# Patient Record
Sex: Male | Born: 1975 | Race: White | Hispanic: No | Smoking: Current every day smoker
Health system: Southern US, Community
[De-identification: ages and names within clinical notes are randomized; demographics above are authoritative.]

## PROBLEM LIST (undated history)

## (undated) HISTORY — PX: BACK SURGERY: SHX140

---

## 2006-05-12 ENCOUNTER — Emergency Department (HOSPITAL_COMMUNITY): Admission: EM | Admit: 2006-05-12 | Discharge: 2006-05-12 | Payer: Self-pay | Admitting: Emergency Medicine

## 2008-01-25 IMAGING — CR DG LUMBAR SPINE COMPLETE 4+V
5 series · 5 of 5 positions shown · non-contrast
Comparison: none

HISTORY: Low back pain, injured after lifting something heavy one week ago

[t l-spine a.p.]
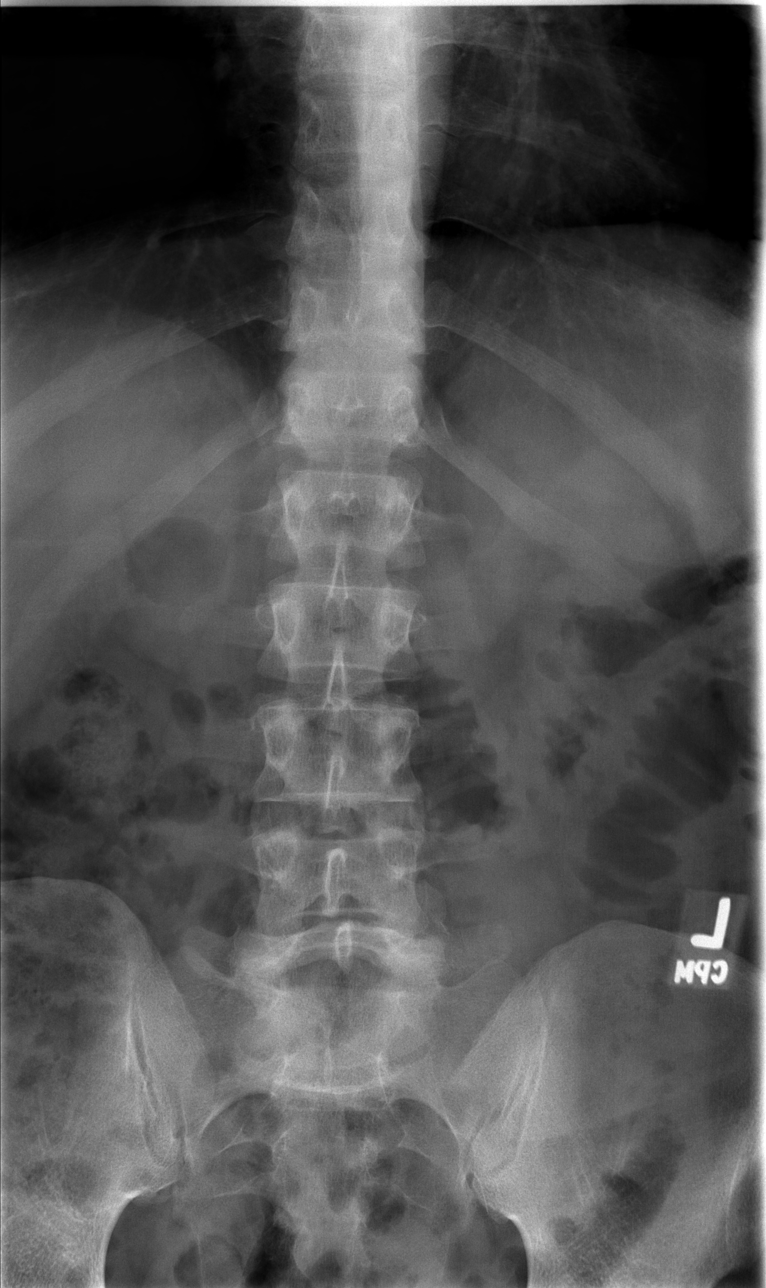

[t l-spine oblique exposure (1 of 2)]
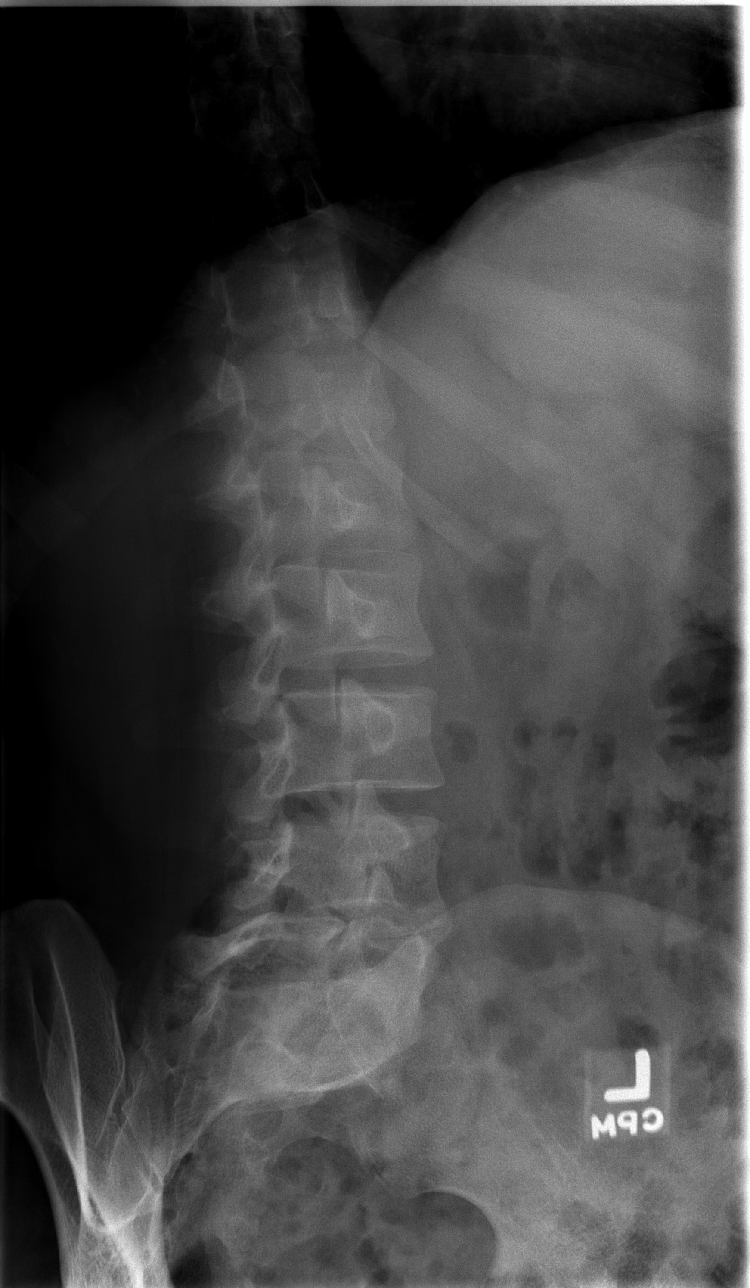

[t l-spine oblique exposure (2 of 2)]
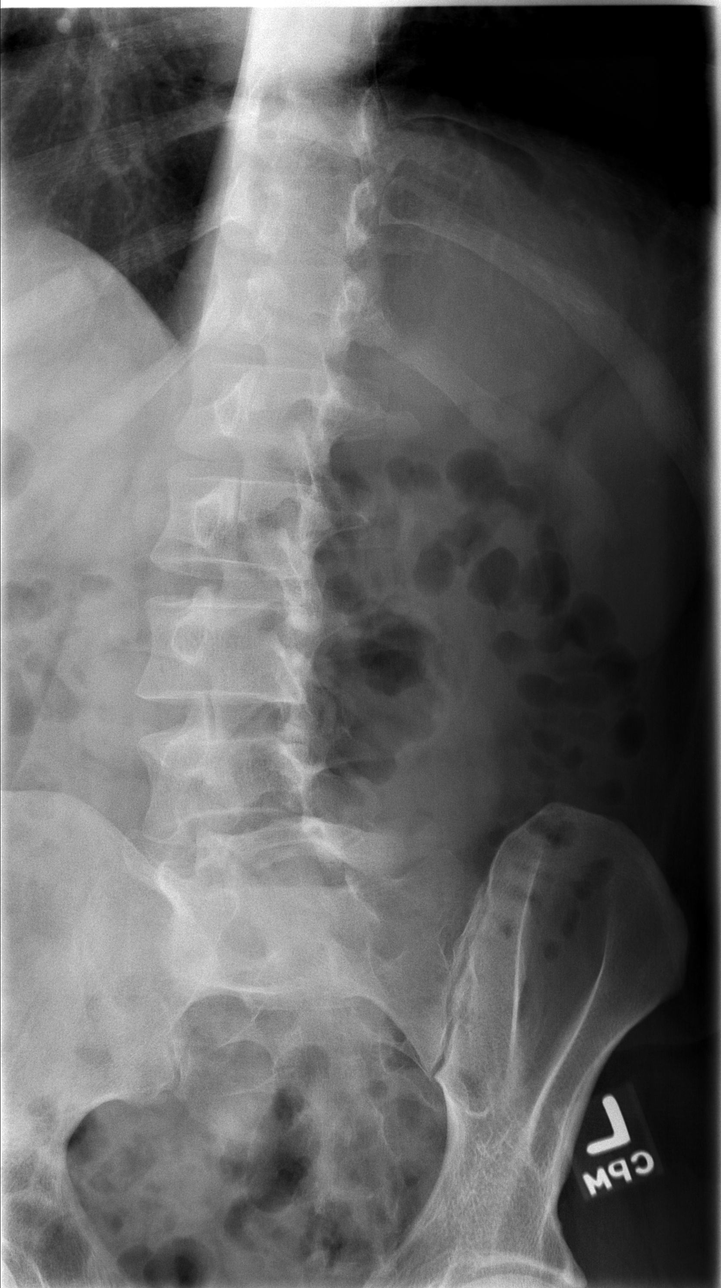

[t l-spine lat]
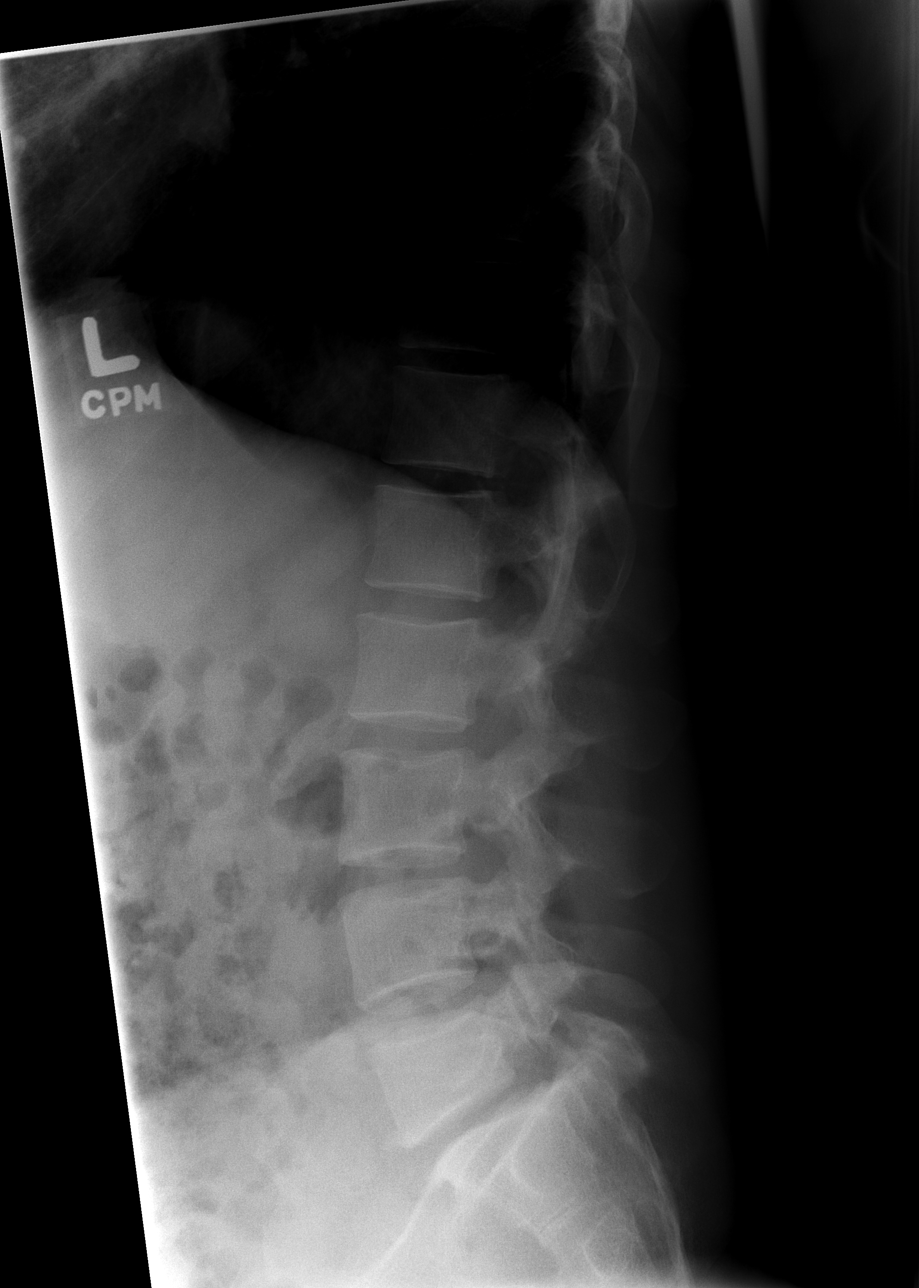

[t l-spine l5-s1 spot]
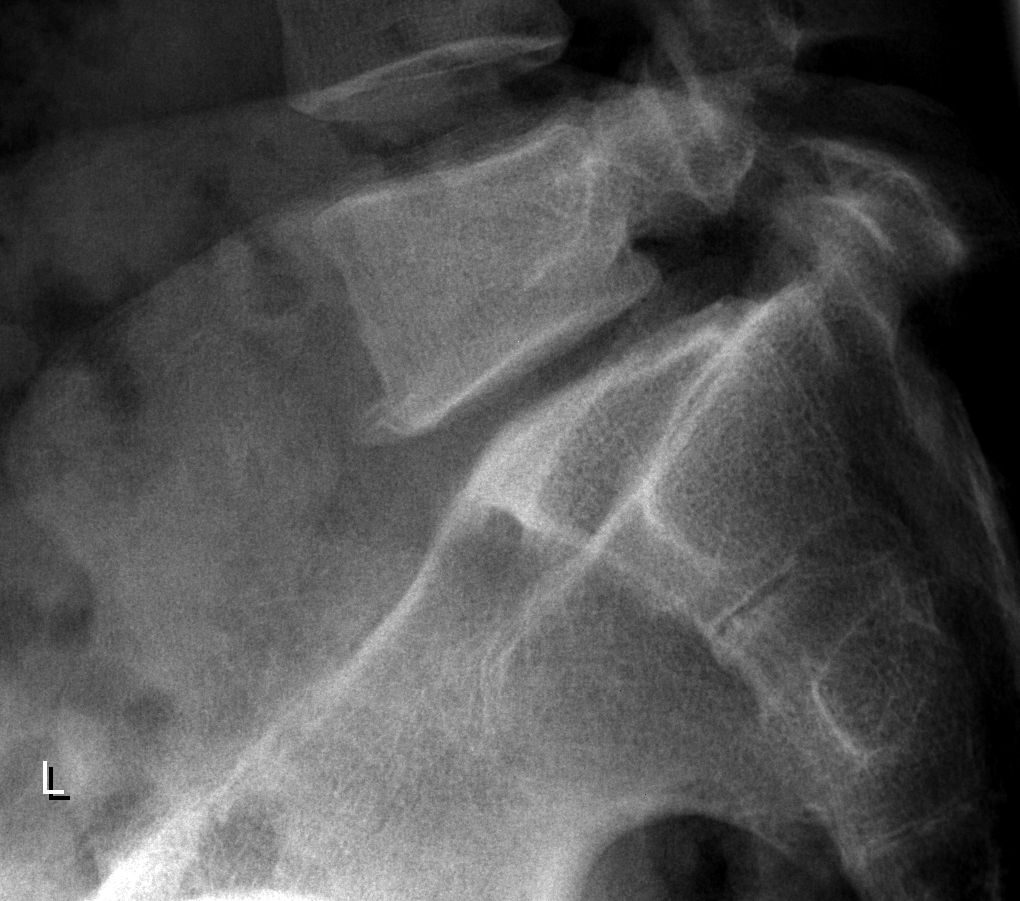

[5 of 5 positions shown; findings below may reference images not displayed]

LUMBAR SPINE 4 VIEWS:

Five lumbar vertebra.
Vertebral body and disc space heights maintained.
No fracture or subluxation.
Left spondylolysis of L5 with grade 1 anterolisthesis L5-S1.
Right pars interarticularis suboptimally visualized but suspect spondylolysis on
right at L5 as well.
No bone destruction.
SI joints symmetric.
IMPRESSION: Probable bilateral spondylolysis of L5 with grade 1 anterolisthesis L5-S1.

## 2012-08-24 DIAGNOSIS — R079 Chest pain, unspecified: Secondary | ICD-10-CM

## 2013-04-30 ENCOUNTER — Emergency Department (HOSPITAL_COMMUNITY): Payer: Self-pay

## 2013-04-30 ENCOUNTER — Emergency Department (HOSPITAL_COMMUNITY)
Admission: EM | Admit: 2013-04-30 | Discharge: 2013-04-30 | Disposition: A | Discharge status: Home / Self Care | Payer: Self-pay | Attending: Emergency Medicine | Admitting: Emergency Medicine

## 2013-04-30 ENCOUNTER — Encounter (HOSPITAL_COMMUNITY): Payer: Self-pay | Admitting: Emergency Medicine

## 2013-04-30 DIAGNOSIS — R079 Chest pain, unspecified: Secondary | ICD-10-CM | POA: Insufficient documentation

## 2013-04-30 DIAGNOSIS — J029 Acute pharyngitis, unspecified: Secondary | ICD-10-CM | POA: Insufficient documentation

## 2013-04-30 DIAGNOSIS — H9209 Otalgia, unspecified ear: Secondary | ICD-10-CM | POA: Insufficient documentation

## 2013-04-30 DIAGNOSIS — F17200 Nicotine dependence, unspecified, uncomplicated: Secondary | ICD-10-CM | POA: Insufficient documentation

## 2013-04-30 DIAGNOSIS — R591 Generalized enlarged lymph nodes: Secondary | ICD-10-CM

## 2013-04-30 DIAGNOSIS — K029 Dental caries, unspecified: Secondary | ICD-10-CM | POA: Insufficient documentation

## 2013-04-30 LAB — I-STAT TROPONIN, ED: Troponin i, poc: 0 ng/mL (ref 0.00–0.08)

## 2013-04-30 LAB — BASIC METABOLIC PANEL
BUN: 15 mg/dL (ref 6–23)
CO2: 23 mEq/L (ref 19–32)
Calcium: 9.2 mg/dL (ref 8.4–10.5)
Chloride: 102 mEq/L (ref 96–112)
Creatinine, Ser: 0.86 mg/dL (ref 0.50–1.35)
GFR calc Af Amer: >90 mL/min (ref >90)
GFR calc non Af Amer: >90 mL/min (ref >90)
Glucose, Bld: 89 mg/dL (ref 70–99)
Potassium: 4 mEq/L (ref 3.7–5.3)
Sodium: 139 mEq/L (ref 137–147)

## 2013-04-30 LAB — CBC
HCT: 45.8 % (ref 39.0–52.0)
Hemoglobin: 16.8 g/dL (ref 13.0–17.0)
MCH: 31.5 pg (ref 26.0–34.0)
MCHC: 36.7 g/dL — ABNORMAL HIGH (ref 30.0–36.0)
MCV: 85.8 fL (ref 78.0–100.0)
Platelets: 204 10*3/uL (ref 150–400)
RBC: 5.34 MIL/uL (ref 4.22–5.81)
RDW: 12.8 % (ref 11.5–15.5)
WBC: 8.8 10*3/uL (ref 4.0–10.5)

## 2013-04-30 MED ORDER — HYDROCODONE-ACETAMINOPHEN 7.5-325 MG/15ML PO SOLN: 15.0000 mL | Freq: Three times a day (TID) | ORAL | Status: DC | PRN

## 2013-04-30 MED ORDER — IBUPROFEN 600 MG PO TABS: 600.0000 mg | ORAL_TABLET | Freq: Four times a day (QID) | ORAL | Status: DC | PRN

## 2013-04-30 MED ORDER — IBUPROFEN 600 MG PO TABS: 600.0000 mg | ORAL_TABLET | Freq: Four times a day (QID) | ORAL | Status: AC | PRN

## 2013-04-30 NOTE — ED Notes (Signed)
No old EKG. New EKG given to Dr Oletta LamasGhim

## 2013-04-30 NOTE — ED Provider Notes (Signed)
CSN: [id number]     Arrival date & time 04/30/13  1912 History   First MD Initiated Contact with Patient 04/30/13 2009     Chief Complaint  Patient presents with  . Chest Pain  . Neck Pain     (Consider location/radiation/quality/duration/timing/severity/associated sxs/prior Treatment) HPI Comments: Patient is a 38 year old male who presents to the emergency department for neck pain. Patient states that he noticed a swollen area to his right anterior neck consistent with a lymph node a few days ago. Patient states that the area is tender to palpation. He also notes that he experiences pain in this area with swallowing; he states it is painful to swallow. Patient states that he has also noticed an intermittent pain in his R chest x 1 week. He denies any pain at present. Pain radiates to his R jaw, per patient, and is worse with deep breathing. Patient endorses a sporadic nonproductive cough. He states he just finished a 5 day course of Amoxicillin on 04/18/13 for a dental infection to his L lower tooth. He denies associated fever, inability to swallow, LOC, oral lesions or bleeding, dental pain to the R side, neck stiffness, SOB, N/V, numbness/tingling, and extremity weakness. Patient is a 1ppd smoker. No significant cardiac history.  Patient is a 38 y.o. male presenting with chest pain and neck pain. The history is provided by the patient. No language interpreter was used.  Chest Pain Associated symptoms: cough and fatigue   Associated symptoms: no dysphagia, no fever, no nausea, no numbness, no shortness of breath, not vomiting and no weakness   Neck Pain Associated symptoms: chest pain   Associated symptoms: no fever, no numbness and no weakness     History reviewed. No pertinent past medical history. Past Surgical History  Procedure Laterality Date  . Back surgery     History reviewed. No pertinent family history. History  Substance Use Topics  . Smoking status: Current Every Day  Smoker -- 1.00 packs/day for 15 years    Types: Cigarettes  . Smokeless tobacco: Not on file  . Alcohol Use: No    Review of Systems  Constitutional: Positive for fatigue. Negative for fever.  HENT: Positive for ear pain (R side) and sore throat. Negative for congestion, dental problem, drooling, ear discharge, mouth sores, rhinorrhea, trouble swallowing and voice change.   Respiratory: Positive for cough. Negative for shortness of breath.   Cardiovascular: Positive for chest pain.  Gastrointestinal: Negative for nausea and vomiting.  Musculoskeletal: Positive for neck pain.  Neurological: Negative for weakness and numbness.  All other systems reviewed and are negative.      Allergies  Review of patient's allergies indicates no known allergies.  Home Medications   Current Outpatient Rx  Name  Route  Sig  Dispense  Refill  . ibuprofen (ADVIL,MOTRIN) 600 MG tablet   Oral   Take 1 tablet (600 mg total) by mouth every 6 (six) hours as needed.   30 tablet   0    BP 117/74  Pulse 68  Temp(Src) 98.3 F (36.8 C) (Oral)  Resp 22  SpO2 96%  Physical Exam  Nursing note and vitals reviewed. Constitutional: He is oriented to person, place, and time. He appears well-developed and well-nourished. No distress.  HENT:  Head: Normocephalic and atraumatic.  Right Ear: Hearing, tympanic membrane, external ear and ear canal normal. No mastoid tenderness.  Left Ear: Hearing, tympanic membrane, external ear and ear canal normal. No mastoid tenderness.  Nose: Nose normal.  Mouth/Throat: Uvula is midline, oropharynx is clear and moist and mucous membranes are normal. No oral lesions. No trismus in the jaw. Dental caries present. No dental abscesses or uvula swelling. No oropharyngeal exudate, posterior oropharyngeal edema or posterior oropharyngeal erythema.  Uvula midline. No medial or swelling. Patient tolerating secretions without difficulty or drooling. No voice change. No tonsillar  enlargement or exudates. No significant erythema to the posterior oropharynx.  Eyes: Conjunctivae and EOM are normal. No scleral icterus.  Neck: Normal range of motion. Neck supple.  No stridor  Cardiovascular: Normal rate, regular rhythm, normal heart sounds and intact distal pulses.   Distal radial pulses 2+. No JVD. No carotid bruits bilaterally.  Pulmonary/Chest: Effort normal and breath sounds normal. No stridor. No respiratory distress. He has no wheezes. He has no rales.  Musculoskeletal: Normal range of motion.  Lymphadenopathy:    He has cervical adenopathy (R anterior cervical; mildly tender to palpation).  Neurological: He is alert and oriented to person, place, and time.  Skin: Skin is warm and dry. No rash noted. He is not diaphoretic. No erythema. No pallor.  Psychiatric: He has a normal mood and affect. His behavior is normal.    ED Course  Procedures (including critical care time) Labs Review Labs Reviewed  CBC - Abnormal; Notable for the following:    MCHC 36.7 (*)    All other components within normal limits  BASIC METABOLIC PANEL  Rosezena Sensor, ED   Imaging Review Dg Chest 2 View  04/30/2013   CLINICAL DATA:  Right chest pain.  EXAM: CHEST  2 VIEW  COMPARISON:  None.  FINDINGS: The heart size and mediastinal contours are within normal limits. Both lungs are clear. The visualized skeletal structures are unremarkable.  IMPRESSION: No active cardiopulmonary disease.   Electronically Signed   By: Amie Portland M.D.   On: 04/30/2013 21:32     EKG Interpretation   Date/Time:  Friday April 30 2013 19:47:26 EDT Ventricular Rate:  76 PR Interval:  170 QRS Duration: 90 QT Interval:  368 QTC Calculation: 414 R Axis:   50 Text Interpretation:  Normal sinus rhythm Normal ECG Confirmed by POLLINA   MD, CHRISTOPHER [phone]) on 04/30/2013 8:41:27 PM      MDM   Final diagnoses:  Pharyngitis  Lymphadenopathy  Chest pain    Uncomplicated cervical lymphadenitis  causing secondary pharyngitis. No red flags or signs concerning for ludwig's angina. No trismus or TTP of R upper/lower dentition. Oropharynx clear and patient tolerating secretions without difficulty. No facial swelling. Heart RRR and lungs CTAB. Cardiac work up unremarkable. Symptoms atypical for ACS. Also doubt atypically presenting dissection given duration of symptoms, palpable lymphadenitis, normal CXR, and hemodynamic stability. Only cardiac risk factor is tobacco use. No active chest pain today. Patient stable and appropriate for d/c with ENT and PCP referrals. Ibuprofen recommended for pain control. Return precautions and resource guide provided. Patient agreeable to plan with no unaddressed concerns.   Filed Vitals:   04/30/13 2100 04/30/13 2130 04/30/13 2145 04/30/13 2200  BP: 116/72 128/68 118/67 117/74  Pulse: 59 73 55 68  Temp:      TempSrc:      Resp: 15 24 15 22   SpO2: 94% 94% 94% 96%       Antony Madura, PA-C 04/30/13 2235

## 2013-04-30 NOTE — Discharge Instructions (Signed)
Cervical Adenitis You have a swollen lymph gland in your neck. This commonly happens with Strep and virus infections, dental problems, insect bites, and injuries about the face, scalp, or neck. The lymph glands swell as the body fights the infection or heals the injury. Swelling and firmness typically lasts for several weeks after the infection or injury is healed. Rarely lymph glands can become swollen because of cancer or TB. Antibiotics are prescribed if there is evidence of an infection. Sometimes an infected lymph gland becomes filled with pus. This condition may require opening up the abscessed gland by draining it surgically. Most of the time infected glands return to normal within two weeks. Do not poke or squeeze the swollen lymph nodes. That may keep them from shrinking back to their normal size. If the lymph gland is still swollen after 2 weeks, further medical evaluation is needed.  SEEK IMMEDIATE MEDICAL CARE IF:  You have difficulty swallowing or breathing, increased swelling, severe pain, or a high fever.  Document Released: 01/21/2005 Document Revised: 04/15/2011 Document Reviewed: 07/13/2006 [facility] Patient Information 2014 [place]Care, [place]. Pharyngitis Pharyngitis is a sore throat (pharynx). There is redness, pain, and swelling of your throat. HOME CARE   Drink enough fluids to keep your pee (urine) clear or pale yellow.  Only take medicine as told by your doctor.  You may get sick again if you do not take medicine as told. Finish your medicines, even if you start to feel better.  Do not take aspirin.  Rest.  Rinse your mouth (gargle) with salt water ( tsp of salt per 1 qt of water) every 1 2 hours. This will help the pain.  If you are not at risk for choking, you can suck on hard candy or sore throat lozenges. GET HELP IF:  You have large, tender lumps on your neck.  You have a rash.  You cough up green, yellow-brown, or bloody spit. GET HELP RIGHT AWAY IF:   You  have a stiff neck.  You drool or cannot swallow liquids.  You throw up (vomit) or are not able to keep medicine or liquids down.  You have very bad pain that does not go away with medicine.  You have problems breathing (not from a stuffy nose). MAKE SURE YOU:   Understand these instructions.  Will watch your condition.  Will get help right away if you are not doing well or get worse. Document Released: 07/10/2007 Document Revised: 11/11/2012 Document Reviewed: 09/28/2012 [facility] DtExitCare Patient Information 2014 [place]Care, [place]LLC.  Emergency Department Resource Guide 1) Find a Doctor and Pay Out of Pocket Although you won't have to find out who is covered by your insurance plan, it is a good idea to ask around and get recommendations. You will then need to call the office and see if the doctor you have chosen will accept you as a new patient and what types of options they offer for patients who are self-pay. Some doctors offer discounts or will set up payment plans for their patients who do not have insurance, but you will need to ask so you aren't surprised when you get to your appointment.  2) Contact Your Local Health Department Not all health departments have doctors that can see patients for sick visits, but many do, so it is worth a call to see if yours does. If you don't know where your local health department is, you can check in your phone book. The CDC also has a tool to help you locate  your state's health department, and many state websites also have listings of all of their local health departments.  3) Find a [facility] If your illness is not likely to be very severe or complicated, you may want to try a walk in clinic. These are popping up all over the country in pharmacies, drugstores, and shopping centers. They're usually staffed by nurse practitioners or physician assistants that have been trained to treat common illnesses and complaints. They're usually fairly quick and  inexpensive. However, if you have serious medical issues or chronic medical problems, these are probably not your best option.  No Primary Care Doctor: - Call Health Connect at  [phone] - they can help you locate a primary care doctor that  accepts your insurance, provides certain services, etc. - Physician Referral Service- [phone]  Chronic Pain Problems: Organization         Address  Phone   Notes  [facility]  [phone] Patients need to be referred by their primary care doctor.   Medication Assistance: Organization         Address  Phone   Notes  [facility] Medication [facility] [address] [place], [place] [address] [phone] --Must be a resident of [facility] -- Must have NO insurance coverage whatsoever (no Medicaid/ Medicare, etc.) -- The pt. MUST have a primary care doctor that directs their care regularly and follows them in the community   [facility]  [phone]   [employer]  [phone]    Agencies that provide inexpensive medical care: Organization         Address  Phone   Notes  [place]  [phone]   [facility]    [phone]   [facility] [place], [place] [address] [phone]   [place] [address] [place] [phone]   Planned Parenthood    [phone]   [facility]    [phone]   [place] [address], [place] Phone:  [phone], Fax:  [phone] Hours of Operation:  9 am - 6 pm, M-F.  Also accepts Medicaid/Medicare and self-pay.  [facility] for [address], [address], [place] Phone: [phone], Fax: [phone]. Hours of Operation:  8:30 am - 5:30 pm, M-F.  Also accepts Medicaid and self-pay.  [facility] [place] [address] [place] Phone: [phone]   [place], [place], [place] [phone], Ext. [phone] Mondays & Thursdays: 7-9 AM.  First 15 patients are seen on a first come, first serve basis.    [place] Providers:  Organization         Address  Phone   Notes  [facility] [address], [place] [phone] Also accepts self-pay patients.  [facility] [address], [place]  [phone]   [place], [address], [place] [phone]   [facility] [address], [place] [phone]   Lucianne Lei [address], [place]   [phone] Only accepts [place] Access [place] patients after they have their name applied to their card.   Self-Pay (no insurance) in [facility]:  Organization  Address  Phone   Notes  Sickle Cell Patients, [facility] [place] [phone]   [facility] [place] [phone]   [facility]  [place], [address], [place] [phone]   [facility]/Dr. Osei-Bonsu  [address] [place] or [address], [place] [phone] Phone number for both [place] and [place] locations is the same.  [facility]gent [facility] and [facility] [address] [place] [phone]   [facility] [address], [place] or [address] [phone]   [facility] [address], [place] [phone], phone; [phone], fax Sees patients 1st and 3rd Saturday of every month.  Must not qualify for public or private insurance (i.e. Medicaid, Medicare, [place] Health Choice, Veterans' Benefits)  Household income should be no more than 200% of the poverty level The clinic cannot treat you if you are pregnant or  think you are pregnant  Sexually transmitted diseases are not treated at the clinic.    Dental Care: Organization         Address  Phone  Notes  [facility] Department of [facility] [place] [phone] Accepts children up to age 3 who are enrolled in [place] or [place]; pregnant women with a Medicaid card; and children who have applied for Medicaid or [place] Health Choice, but were declined, whose parents can pay a reduced fee at time of service.  [facility] of [facility]  [address], [place] [phone] Accepts children up to age 81 who are enrolled in [place] or [place]; pregnant women with a Medicaid card; and children who have applied for Medicaid or [place] Health Choice, but were declined, whose parents can pay a reduced fee at time of service.  [place] [facility]  [place] [phone] Patients are seen by appointment only. Walk-ins are not accepted. [place] will see patients 82 years of age and older. Monday - Tuesday (8am-5pm) Most Wednesdays (8:30-5pm) $30 per visit, cash only  [facility] Access PROGRAM  [address], [facility] [phone] Patients are seen by appointment only. Walk-ins are not accepted. [place] will see patients 17 years of age and older. One Wednesday Evening (Monthly: Volunteer Based).  $30 per visit, cash only  [place]  [phone] for adults; Children under age 57, call Graduate Pediatric Dentistry at [phone]. Children aged 44-14, please call [phone] to request a pediatric application.  Dental services are provided in all areas of dental care including fillings, crowns and bridges, complete and partial dentures, implants, gum treatment, root canals, and extractions. Preventive care is also provided. Treatment is provided to both adults  and children. Patients are selected via a lottery and there is often a waiting list.   [facility] [address] [place]  [phone] [email or web address]   [facility] [address] [place], [place] [phone], Ext. [phone] Second and Fourth Thursday of each month, opens at 6:30 AM; Clinic ends at 9 AM.  Patients are seen on a first-come first-served basis, and a limited number are seen during each clinic.   [facility]  [address] [place], [place]  [place], [place] [phone]   Eligibility Requirements °You must have lived in [place], [place], or [place] counties for at least the last three months. °  You cannot be eligible for state or federal sponsored healthcare insurance, including Veterans Administration, Medicaid, or Medicare. °  You generally cannot be eligible for healthcare insurance through your employer.  °  How to apply: °Eligibility screenings are held every Tuesday and Wednesday afternoon from 1:00 pm until 4:00 pm. You do not need an appointment for the interview!  °[facility] [address], [place], [place] [phone]   °[facility]  [phone]   °[facility]  [phone]   °[facility]  336-570-6415   ° °Behavioral Health Resources in the Community: °Intensive Outpatient Programs °Organization         Address  Phone  Notes  °[facility] [address], [place], [place] [phone]   °[facility] [address], [place], [place] [phone]   °[facility] [address], [place], [place] ° 336-882-2125   °[facility] [address],  °[place], [place] 1-800-853-5163 or 336-641-4981   °Substance Abuse Resources °Organization         Address  Phone  Notes  °[facility]  336-882-2125   °[facility]  336-784-9470   °[facility]  [phone]     °[facility]  336-845-3988   °Residential & Outpatient Substance Abuse Program  1-800-659-3381   °Psychological Services °Organization         Address  Phone  Notes  °[facility]  336- [phone]   °[facility]  336- 378-7881   °[facility] [address], [place] 1-800-853-5163 or 336-641-4981   ° °Mobile Crisis Teams °Organization         Address  Phone  Notes  °[facility], Mobile Crisis Care Unit  1-877-626-1772   °Assertive °[facility] Services ° [address] [place], [place] 336-834-9664   °Sharon DeEsch [address] °[place] 336-554-5454   ° °Self-Help/Support Groups °Organization         Address  Phone             Notes  °[facility] [place] - variety of support groups  336- 373-1402 Call for more information  °Narcotics Anonymous (NA), [facility] [address], °[place]  2 meetings at this location  ° °Residential Treatment Programs °Organization         Address  Phone  Notes  °[facility] [address],    °[place]  1-866-801-8205   °[facility] ° [address], [place], [place] [phone]   °[facility] [address], [place] 336-845-3988 Admissions: 8am-3pm M-F  °[facility] [address]    °[place], [place] 336-841-1104   °[facility] [address], [place], [place] 336-379-7146   °[facility] [address]  °[place], [place] 336-285-9073   °[facility] - Intensive Outpatient [address], [place], [place] 336-852-3033   °[facility] ([facility] [address]  °[place], [place] 1-877-615-2722 or 336-784-9470   °[facility] ([facility]) [address] [place], [place] 336-227-7417 Accepts Medicaid  °[facility] [address]  °[place] 1-800-659-3381 Substance Abuse/Addiction Treatment  ° °[place]  Behavioral Health Resources °  Organization         Address  Phone  Notes  [facility]  [phone]   Angie Fava, PhD [address] [place], [place]   [phone] or [phone]   [facility]   [address] [place], [place] [phone]   [facility] [address] [place], [place] [phone] Insurance/Medicaid/sponsorship through [facility] [address]                                    [place], [place] [phone] Therapy/tele-psych/case  [facility] [address], [place] [phone]    Dr. Lolly Mustache  [phone]   [facility]  [employer] [facility] Dept. 1) [address] [place] 2) [address] [place] 3)  [address], [place] [phone]  [phone]   [facility] Child Abuse Hotline [phone]) [phone] or 4420492248 (After Hours)

## 2013-04-30 NOTE — ED Notes (Signed)
Update: According to Pa-C, pt. On ABX for dental infection, lower lt. Side.

## 2013-04-30 NOTE — ED Notes (Addendum)
Patient arrives from home with complaint of stabbing pain in right chest which radiated to right jaw. Pain in right jaw has gradually increased in intensity over the past week. Also complains of headache, lightheadedness, and feeling faint. Recently took ABX for tooth infection.

## 2013-05-02 NOTE — ED Provider Notes (Signed)
I personally performed the services described in this documentation, which was scribed in my presence. The recorded information has been reviewed and is accurate.   Gilda Creasehristopher J. Pollina, MD 05/02/13 [phone]

## 2015-01-13 IMAGING — CR DG CHEST 2V
2 series · 2 of 2 positions shown · non-contrast
Comparison: None.

CLINICAL DATA: Right chest pain.

EXAM:
CHEST  2 VIEW

[w chest pa]
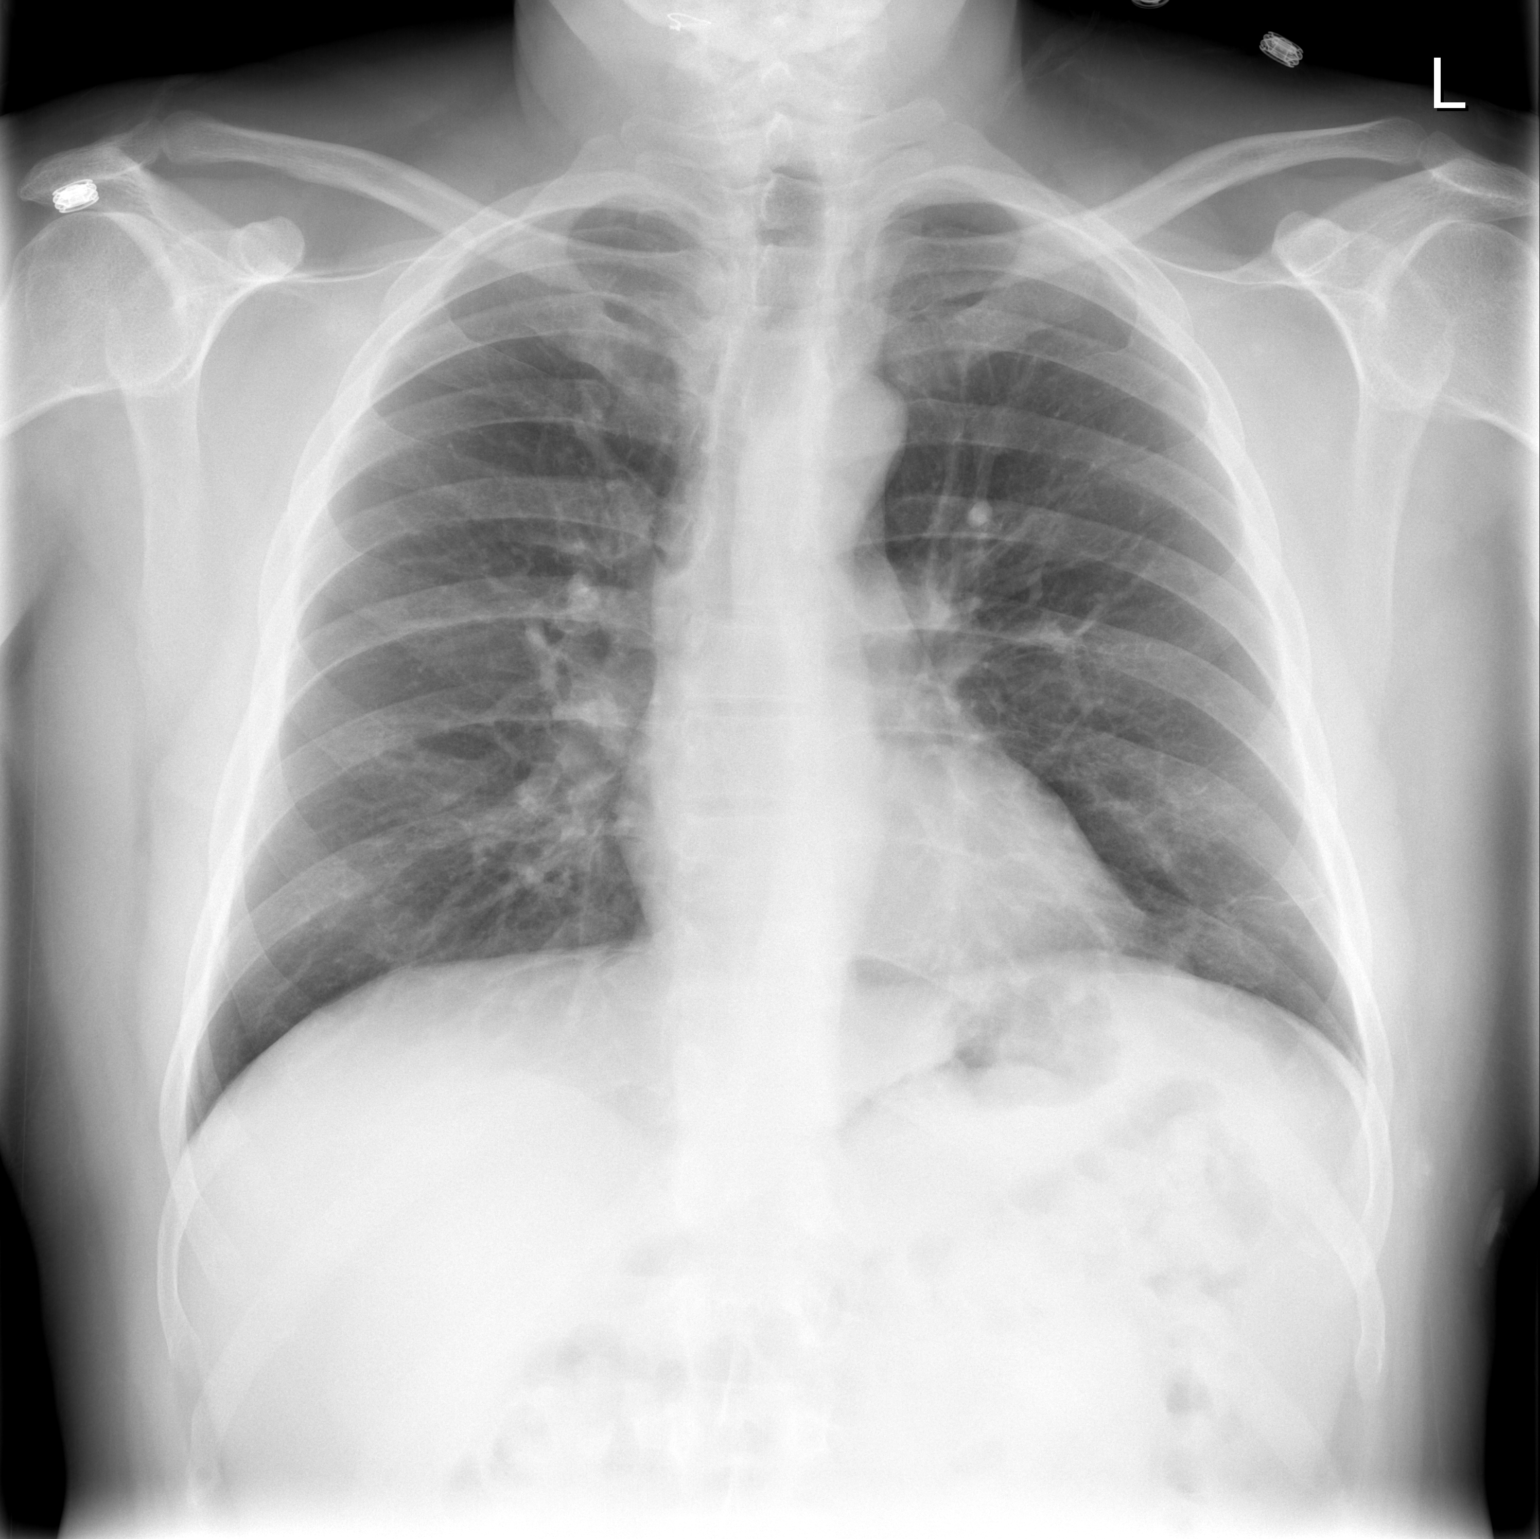

[w chest lat]
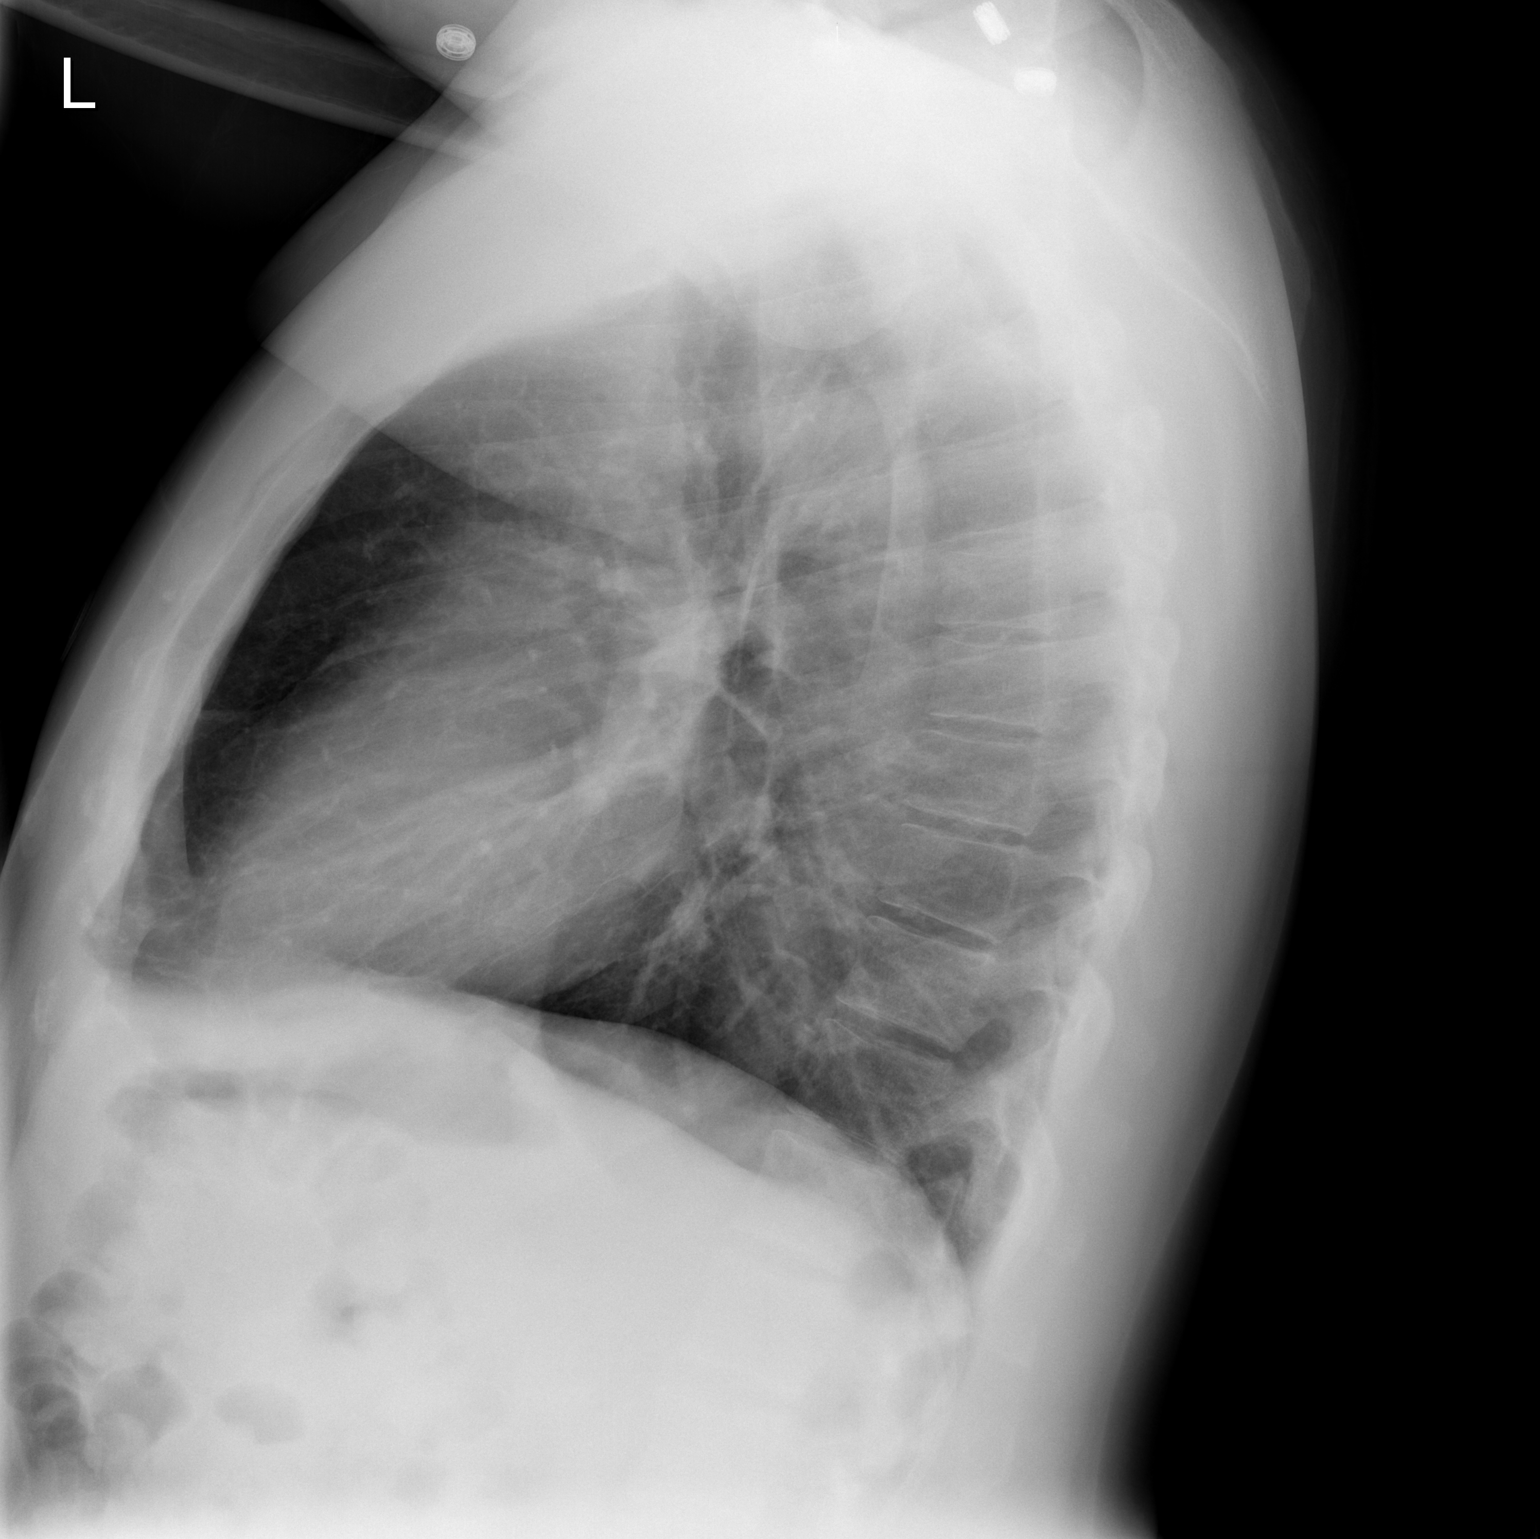

[2 of 2 positions shown; findings below may reference images not displayed]

FINDINGS: The heart size and mediastinal contours are within normal limits.
Both lungs are clear. The visualized skeletal structures are
unremarkable.
IMPRESSION: No active cardiopulmonary disease.
